# Patient Record
Sex: Female | Born: 2005 | Race: White | Hispanic: No | Marital: Single | Smoking: Never smoker
Health system: Southern US, Community
[De-identification: ages and names within clinical notes are randomized; demographics above are authoritative.]

## PROBLEM LIST (undated history)

## (undated) DIAGNOSIS — L709 Acne, unspecified: Secondary | ICD-10-CM

## (undated) HISTORY — DX: Acne, unspecified: L70.9

---
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
## 2024-02-24 ENCOUNTER — Ambulatory Visit (INDEPENDENT_AMBULATORY_CARE_PROVIDER_SITE_OTHER)

## 2024-02-24 DIAGNOSIS — Z3042 Encounter for surveillance of injectable contraceptive: Secondary | ICD-10-CM | POA: Diagnosis not present

## 2024-02-24 MED ORDER — MEDROXYPROGESTERONE ACETATE 150 MG/ML IM SUSY
150.0000 mg | PREFILLED_SYRINGE | Freq: Once | INTRAMUSCULAR | Status: AC
  Administered 2024-02-24: 150 mg via INTRAMUSCULAR

## 2024-02-24 NOTE — Progress Notes (Signed)
 Patient is in office today for a nurse visit for Depo Shot. Patient Injection was given in the  Left deltoid. Patient tolerated injection well.

## 2024-04-01 ENCOUNTER — Encounter: Admitting: Nurse Practitioner

## 2024-04-02 ENCOUNTER — Encounter: Payer: Self-pay | Admitting: Nurse Practitioner

## 2024-04-06 ENCOUNTER — Ambulatory Visit (INDEPENDENT_AMBULATORY_CARE_PROVIDER_SITE_OTHER): Admitting: Nurse Practitioner

## 2024-04-06 VITALS — BP 116/72 | HR 94 | Temp 98.5°F | Resp 18 | Ht 65.5 in | Wt 217.6 lb

## 2024-04-06 DIAGNOSIS — Z13220 Encounter for screening for lipoid disorders: Secondary | ICD-10-CM

## 2024-04-06 DIAGNOSIS — Z131 Encounter for screening for diabetes mellitus: Secondary | ICD-10-CM

## 2024-04-06 DIAGNOSIS — Z0000 Encounter for general adult medical examination without abnormal findings: Secondary | ICD-10-CM

## 2024-04-06 DIAGNOSIS — Z130 Encounter for screening for diseases of the blood and blood-forming organs and certain disorders involving the immune mechanism: Secondary | ICD-10-CM

## 2024-04-06 LAB — COMPREHENSIVE METABOLIC PANEL WITH GFR
AG Ratio: 1.8 (calc) (ref 1.0–2.5)
ALT: 21 U/L (ref 5–32)
AST: 19 U/L (ref 12–32)
Albumin: 4.6 g/dL (ref 3.6–5.1)
Alkaline phosphatase (APISO): 69 U/L (ref 36–128)
BUN: 10 mg/dL (ref 7–20)
CO2: 25 mmol/L (ref 20–32)
Calcium: 9.5 mg/dL (ref 8.9–10.4)
Chloride: 105 mmol/L (ref 98–110)
Creat: 0.79 mg/dL (ref 0.50–0.96)
Globulin: 2.5 g/dL (ref 2.0–3.8)
Glucose, Bld: 94 mg/dL (ref 65–99)
Potassium: 4.4 mmol/L (ref 3.8–5.1)
Sodium: 139 mmol/L (ref 135–146)
Total Bilirubin: 0.7 mg/dL (ref 0.2–1.1)
Total Protein: 7.1 g/dL (ref 6.3–8.2)
eGFR: 110 mL/min/1.73m2 (ref >=60)

## 2024-04-06 LAB — CBC WITH DIFFERENTIAL/PLATELET
Absolute Lymphocytes: 2669 {cells}/uL (ref 850–3900)
Absolute Monocytes: 689 {cells}/uL (ref 200–950)
Basophils Absolute: 34 {cells}/uL (ref 0–200)
Basophils Relative: 0.4 %
Eosinophils Absolute: 128 {cells}/uL (ref 15–500)
Eosinophils Relative: 1.5 %
HCT: 47.3 % — ABNORMAL HIGH (ref 35.0–45.0)
Hemoglobin: 16.2 g/dL — ABNORMAL HIGH (ref 11.7–15.5)
MCH: 31 pg (ref 27.0–33.0)
MCHC: 34.2 g/dL (ref 32.0–36.0)
MCV: 90.6 fL (ref 80.0–100.0)
MPV: 11.7 fL (ref 7.5–12.5)
Monocytes Relative: 8.1 %
Neutro Abs: 4981 {cells}/uL (ref 1500–7800)
Neutrophils Relative %: 58.6 %
Platelets: 222 Thousand/uL (ref 140–400)
RBC: 5.22 Million/uL — ABNORMAL HIGH (ref 3.80–5.10)
RDW: 11.8 % (ref 11.0–15.0)
Total Lymphocyte: 31.4 %
WBC: 8.5 Thousand/uL (ref 3.8–10.8)

## 2024-04-06 LAB — HEMOGLOBIN A1C
Hgb A1c MFr Bld: 5.1 % (ref <5.7)
Mean Plasma Glucose: 100 mg/dL
eAG (mmol/L): 5.5 mmol/L

## 2024-04-06 LAB — LIPID PANEL
Cholesterol: 162 mg/dL (ref <170)
HDL: 41 mg/dL — ABNORMAL LOW (ref >45)
LDL Cholesterol (Calc): 99 mg/dL (ref <110)
Non-HDL Cholesterol (Calc): 121 mg/dL — ABNORMAL HIGH (ref <120)
Total CHOL/HDL Ratio: 4 (calc) (ref <5.0)
Triglycerides: 119 mg/dL — ABNORMAL HIGH (ref <90)

## 2024-04-06 NOTE — Progress Notes (Signed)
 Name: Abigail Pitts   MRN: [id number]    DOB: Dec 29, 2005   Date:04/06/2024       Progress Note  Subjective  Chief Complaint  Chief Complaint  Patient presents with   Annual Exam    HPI  Patient presents for annual CPE. Discussed the use of AI scribe software for clinical note transcription with the patient, who gave verbal consent to proceed.  History of Present Illness Abigail Pitts is a 18 year old female who presents for an annual physical exam.  General health maintenance - No new symptoms or concerns at this time - Eats a well-balanced diet - Obtains eight or more hours of sleep per night  Dental and vision care - Last dental exam was over a year ago, plans to have one in the coming year - Last eye exam was four months ago  Gynecologic and contraceptive health - Uses Depo-Provera  for contraception - No psychological side effects from Depo-Provera  - No issues with incontinence  Infectious disease screening - No recent HIV or syphilis testing since last performed one year ago - Not opting to repeat HIV or syphilis testing at this time  Safety and social environment - No concerns regarding violence at home    Diet: well balanced diet Exercise: daily  Sleep: 8 hours Last dental exam:a while ago, scheduled Last eye exam: 4 months ago  [employer] Visit from 04/01/2023 in [facility]  AUDIT-C Score 0   Depression: Phq 9 is  negative    04/06/2024    8:55 AM 04/01/2023   12:59 PM 03/29/2022    9:18 AM  Depression screen PHQ 2/9  Decreased Interest 0 0 0  Down, Depressed, Hopeless 0 0 0  PHQ - 2 Score 0 0 0  Altered sleeping 0    Tired, decreased energy 0    Change in appetite 0    Feeling bad or failure about yourself  0    Trouble concentrating 0    Moving slowly or fidgety/restless 0    Suicidal thoughts 0    PHQ-9 Score 0    Difficult doing work/chores Not difficult at all     Hypertension: BP Readings from  Last 3 Encounters:  04/06/24 116/72  09/10/23 120/70  04/01/23 118/84   Obesity: Wt Readings from Last 3 Encounters:  04/06/24 217 lb 9.6 oz (98.7 kg) (99%, Z= 2.18)*  09/10/23 193 lb 14.4 oz (88 kg) (97%, Z= 1.88)*  04/01/23 189 lb 12.8 oz (86.1 kg) (97%, Z= 1.83)*   * Growth percentiles are based on CDC (Girls, 2-20 Years) data.   BMI Readings from Last 3 Encounters:  04/06/24 35.66 kg/m (97%, Z= 1.92, 115% of 95%ile)*  09/10/23 32.27 kg/m (96%, Z= 1.73, 105% of 95%ile)*  04/01/23 31.58 kg/m (96%, Z= 1.71, 104% of 95%ile)*   * Growth percentiles are based on CDC (Girls, 2-20 Years) data.     Vaccines:  HPV: up to at age 21 , ask insurance if age between 11-45  Shingrix: 52-64 yo and ask insurance if covered when patient above 62 yo Pneumonia:  educated and discussed with patient. Flu:  educated and discussed with patient.  Hep C Screening: completed STD testing and prevention (HIV/chl/gon/syphilis): completed Intimate partner violence:none Sexual History : sexually active,  doing depo Menstrual History/LMP/Abnormal Bleeding:  currently on depo Incontinence Symptoms: none  Breast cancer:  - Last Mammogram: does not qualify - BRCA gene screening: none  Osteoporosis: Discussed high calcium and  vitamin D  supplementation, weight bearing exercises  Cervical cancer screening: does not qualify  Skin cancer: Discussed monitoring for atypical lesions  Colorectal cancer: does not qualify   Lung cancer:   Low Dose CT Chest recommended if Age 100-80 years, 20 pack-year currently smoking OR have quit w/in 15years. Patient does not qualify.   ECG: none  Advanced Care Planning: A voluntary discussion about advance care planning including the explanation and discussion of advance directives.  Discussed health care proxy and Living will, and the patient was able to identify a health care proxy as Mom.  Patient does not have a living will at present time. If patient does have living  will, I have requested they bring this to the clinic to be scanned in to their chart.  Lipids: Lab Results  Component Value Date   CHOL 143 04/01/2023   Lab Results  Component Value Date   HDL 41 (L) 04/01/2023   Lab Results  Component Value Date   LDLCALC 84 04/01/2023   Lab Results  Component Value Date   TRIG 89 04/01/2023   Lab Results  Component Value Date   CHOLHDL 3.5 04/01/2023   No results found for: LDLDIRECT  Glucose: Glucose, Bld  Date Value Ref Range Status  04/01/2023 94 65 - 99 mg/dL Final    Comment:    .            Fasting reference interval .     There are no active problems to display for this patient.   History reviewed. No pertinent surgical history.  History reviewed. No pertinent family history.  Social History   Socioeconomic History   Marital status: Single    Spouse name: Not on file   Number of children: Not on file   Years of education: Not on file   Highest education level: 12th grade  Occupational History   Not on file  Tobacco Use   Smoking status: Never   Smokeless tobacco: Never  Vaping Use   Vaping status: Never Used  Substance and Sexual Activity   Alcohol use: Never   Drug use: Never   Sexual activity: Never  Other Topics Concern   Not on file  Social History Narrative   [employer] student to be a [place]   Social Drivers of Corporate investment banker Strain: Low Risk  (04/06/2024)   Overall Financial Resource Strain (CARDIA)    Difficulty of Paying Living Expenses: Not hard at all  Food Insecurity: No Food Insecurity (04/06/2024)   Hunger Vital Sign    Worried About Running Out of Food in the Last Year: Never true    Ran Out of Food in the Last Year: Never true  Transportation Needs: No Transportation Needs (04/06/2024)   PRAPARE - Administrator, Civil Service (Medical): No    Lack of Transportation (Non-Medical): No  Physical Activity: Sufficiently Active (04/06/2024)   Exercise Vital Sign     Days of Exercise per Week: 4 days    Minutes of Exercise per Session: 60 min  Stress: No Stress Concern Present (04/06/2024)   [employer] of Occupational Health - Occupational Stress Questionnaire    Feeling of Stress: Only a little  Social Connections: Unknown (04/06/2024)   Social Connection and Isolation Panel    Frequency of Communication with Friends and Family: More than three times a week    Frequency of Social Gatherings with Friends and Family: More than three times a week    Attends  Religious Services: More than 4 times per year    Active Member of Clubs or Organizations: No    Attends Banker Meetings: Not on file    Marital Status: Patient declined  Intimate Partner Violence: Not At Risk (04/06/2024)   Humiliation, Afraid, Rape, and Kick questionnaire    Fear of Current or Ex-Partner: No    Emotionally Abused: No    Physically Abused: No    Sexually Abused: No     Current Outpatient Medications:    medroxyPROGESTERone  (DEPO-PROVERA ) 150 MG/ML injection, Inject 1 mL (150 mg total) into the muscle every 3 (three) months., Disp: 1 mL, Rfl: 3  Current Facility-Administered Medications:    medroxyPROGESTERone  Acetate SUSY 150 mg, 150 mg, Intramuscular, Q90 days, Gloyd Happ F, FNP, 150 mg at 12/02/23 1438   medroxyPROGESTERone  Acetate SUSY 150 mg, 150 mg, Intramuscular, Q90 days, Damariz Paganelli F, FNP, 150 mg at 09/10/23 1128  No Known Allergies   ROS  Constitutional: Negative for fever or weight change.  Respiratory: Negative for cough and shortness of breath.   Cardiovascular: Negative for chest pain or palpitations.  Gastrointestinal: Negative for abdominal pain, no bowel changes.  Musculoskeletal: Negative for gait problem or joint swelling.  Skin: Negative for rash.  Neurological: Negative for dizziness or headache.  No other specific complaints in a complete review of systems (except as listed in HPI above).   Objective  Vitals:   04/06/24  0853  BP: 116/72  Pulse: 94  Resp: 18  Temp: 98.5 F (36.9 C)  SpO2: 96%  Weight: 217 lb 9.6 oz (98.7 kg)  Height: 5' 5.5 (1.664 m)    Body mass index is 35.66 kg/m.  Physical Exam Vitals reviewed.  Constitutional:      Appearance: Normal appearance.  HENT:     Head: Normocephalic.     Right Ear: Tympanic membrane normal.     Left Ear: Tympanic membrane normal.     Nose: Nose normal.  Eyes:     Extraocular Movements: Extraocular movements intact.     Conjunctiva/sclera: Conjunctivae normal.     Pupils: Pupils are equal, round, and reactive to light.  Neck:     Thyroid: No thyroid mass, thyromegaly or thyroid tenderness.  Cardiovascular:     Rate and Rhythm: Normal rate and regular rhythm.     Pulses: Normal pulses.     Heart sounds: Normal heart sounds.  Pulmonary:     Effort: Pulmonary effort is normal.     Breath sounds: Normal breath sounds.  Abdominal:     General: Bowel sounds are normal.     Palpations: Abdomen is soft.  Musculoskeletal:        General: Normal range of motion.     Cervical back: Normal range of motion and neck supple.     Right lower leg: No edema.     Left lower leg: No edema.  Skin:    General: Skin is warm and dry.     Capillary Refill: Capillary refill takes less than 2 seconds.  Neurological:     General: No focal deficit present.     Mental Status: She is alert and oriented to person, place, and time. Mental status is at baseline.  Psychiatric:        Mood and Affect: Mood normal.        Behavior: Behavior normal.        Thought Content: Thought content normal.        Judgment: Judgment normal.  No results found for this or any previous visit (from the past 2160 hours).    Fall Risk:    04/06/2024    8:55 AM 09/10/2023    7:33 AM 04/01/2023   12:59 PM 03/29/2022    9:18 AM  Fall Risk   Falls in the past year? 0 0 0 0  Number falls in past yr: 0 0 0 0  Injury with Fall? 0 0 0 0  Risk for fall due to :  No Fall Risks     Follow up Falls evaluation completed Falls evaluation completed  Falls evaluation completed      Data saved with a previous flowsheet row definition     Functional Status Survey: Is the patient deaf or have difficulty hearing?: No Does the patient have difficulty seeing, even when wearing glasses/contacts?: No Does the patient have difficulty concentrating, remembering, or making decisions?: No Does the patient have difficulty walking or climbing stairs?: No Does the patient have difficulty dressing or bathing?: No Does the patient have difficulty doing errands alone such as visiting a doctor's office or shopping?: No   Assessment & Plan  Problem List Items Addressed This Visit   None Visit Diagnoses       Annual physical exam    -  Primary     Screening for deficiency anemia         Screening for cholesterol level         Screening for diabetes mellitus          Assessment and Plan Assessment & Plan Adult Wellness Visit 18 year old female with no new health concerns. Maintains a well-balanced diet and adequate sleep. Blood pressure is within normal limits. Continues Depo-Provera  for contraception without psychological side effects. No issues with incontinence or domestic violence. Wishes to keep her mother as her healthcare proxy. Previous screenings for HIV and syphilis were done a year ago.  - Continue Depo-Provera  for contraception - Maintain current healthcare proxy - Plan for dental exam in the coming year - Begin Pap smears at age 60    -USPSTF grade A and B recommendations reviewed with patient; age-appropriate recommendations, preventive care, screening tests, etc discussed and encouraged; healthy living encouraged; see AVS for patient education given to patient -Discussed importance of 150 minutes of physical activity weekly, eat two servings of fish weekly, eat one serving of tree nuts ( cashews, pistachios, pecans, almonds.SABRA) every other day, eat 6 servings of  fruit/vegetables daily and drink plenty of water and avoid sweet beverages.   -Reviewed Health Maintenance: yes

## 2024-04-08 ENCOUNTER — Ambulatory Visit: Payer: Self-pay | Admitting: Nurse Practitioner

## 2024-05-18 ENCOUNTER — Ambulatory Visit (INDEPENDENT_AMBULATORY_CARE_PROVIDER_SITE_OTHER)

## 2024-05-18 DIAGNOSIS — Z3042 Encounter for surveillance of injectable contraceptive: Secondary | ICD-10-CM | POA: Diagnosis not present

## 2024-05-18 MED ORDER — MEDROXYPROGESTERONE ACETATE 150 MG/ML IM SUSY: PREFILLED_SYRINGE | Freq: Once | INTRAMUSCULAR | Status: AC

## 2024-05-18 NOTE — Progress Notes (Signed)
 Patient is in office today for a nurse visit for Birth Control Injection. Patient Injection was given in the  Left deltoid. Patient tolerated injection well.

## 2024-08-13 ENCOUNTER — Ambulatory Visit

## 2024-08-13 DIAGNOSIS — Z3042 Encounter for surveillance of injectable contraceptive: Secondary | ICD-10-CM

## 2024-08-13 MED ORDER — MEDROXYPROGESTERONE ACETATE 150 MG/ML IM SUSY
150.0000 mg | PREFILLED_SYRINGE | INTRAMUSCULAR | Status: AC
  Administered 2024-08-13: 11:00:00 150 mg via INTRAMUSCULAR

## 2024-08-13 NOTE — Progress Notes (Signed)
 Patient is in office today for a nurse visit for Birth Control Injection.

## 2025-04-07 ENCOUNTER — Encounter: Admitting: Nurse Practitioner
